# Patient Record
Sex: Male | Born: 1990 | Race: Black or African American | Hispanic: No | Marital: Single | Smoking: Never smoker
Health system: Southern US, Community
[De-identification: ages and names within clinical notes are randomized; demographics above are authoritative.]

---
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
## 2014-09-25 ENCOUNTER — Ambulatory Visit: Payer: Self-pay

## 2014-09-25 ENCOUNTER — Ambulatory Visit
Admission: EM | Admit: 2014-09-25 | Discharge: 2014-09-25 | Disposition: A | Discharge status: Home / Self Care | Payer: Self-pay | Attending: Family Medicine | Admitting: Family Medicine

## 2014-09-25 DIAGNOSIS — W228XXA Striking against or struck by other objects, initial encounter: Secondary | ICD-10-CM | POA: Insufficient documentation

## 2014-09-25 DIAGNOSIS — S92314A Nondisplaced fracture of first metatarsal bone, right foot, initial encounter for closed fracture: Secondary | ICD-10-CM | POA: Insufficient documentation

## 2014-09-25 DIAGNOSIS — S92301A Fracture of unspecified metatarsal bone(s), right foot, initial encounter for closed fracture: Secondary | ICD-10-CM

## 2014-09-25 NOTE — Discharge Instructions (Signed)
Metatarsal Fracture  °with Rehab °A metatarsal fracture is a break (fracture) of one of the bones of the mid-foot (metatarsal bones). The metatarsal bones are responsible for maintaining the arch of the foot. There are three classifications of metatarsal fractures: dancer's fractures, Jones fractures, and stress fractures. A dancer's fracture is when a piece of bone is pulled off by a ligament or tendon (avulsion fracture) of the outer part of the foot (fifth metatarsal), near the joint with the ankle bones. A Jones fracture occurs in the middle of the fifth metatarsal. These fractures have limited ability to heal. A stress fracture occurs when the bone is slowly injured faster than it can repair itself. °SYMPTOMS  °· Sharp pain, especially with standing or walking. °· Tenderness, swelling, and later bruising (contusion) of the foot. °· Numbness or paralysis from swelling in the foot, causing pressure on the blood vessels or nerves (uncommon). °CAUSES  °Fractures occur when a force is placed on the bone that is greater than it can handle. Common causes of injury include: °· Direct hit (trauma) to the foot. °· Twisting injury to the foot or ankle. °· Landing on the foot and ankle in an improper position. °RISK INCRESES WITH: °· Participation in contact sports, sports that require jumping and landing, or sports in which cleats are worn and sliding occurs. °· Previous foot or ankle sprains or dislocations. °· Repeated injury to any joint in the foot. °· Poor strength and flexibility. °PREVENTION °· Warm up and stretch properly before an activity. °· Allow for adequate recovery between workouts. °· Maintain physical fitness in: °¨ Strength, flexibility, and endurance. °¨ Cardiovascular fitness. °· When participating in jumping or contact sports, protect joints with supportive devices, such as wrapped elastic bandages, tape, braces, or high-top athletic shoes. °· Wear properly fitted and padded protective  equipment. °PROGNOSIS °If treated properly, metatarsal fractures usually heal well. Jones fractures have a higher risk of the bone failing to heal (nonunion). Sometimes, surgery is needed to heal Jones fractures.  °RELATED COMPLICATIONS  °· Nonunion. °· Fracture heals in a poor position (malunion). °· Long-term (chronic) pain, stiffness, or swelling of the foot. °· Excessive bleeding in the foot or at the dislocation site, causing pressure and injury to nerves and blood vessels (rare). °· Unstable or arthritic joint following repeated injury or delayed treatment. °TREATMENT  °Treatment first involves the use of ice and medicine, to reduce pain and inflammation. If the bone fragments are out of alignment (displaced), then immediate realigning of the bones (reduction) is required. Fractures that cannot be realigned by hand, or where the bones protrude through the skin (open), may require surgery to hold the fracture in place with screws, pins, and plates. After the bones are in proper alignment, the foot and ankle must be restrained for 6 or more weeks. Restraint allows healing to occur. After restraint, it is important to perform strengthening and stretching exercises to help regain strength and a full range of motion. These exercises may be completed at home or with a therapist. A stiff-soled shoe and arch support (orthotic) may be required when first returning to sports. °MEDICATION  °· If pain medicine is needed, nonsteroidal anti-inflammatory medicines (NSAIDS), or other minor pain relievers, are often advised. °· Do not take pain medicine for 7 days before surgery. °· Only take over-the-counter or prescription medicines for pain, fever, or discomfort as directed by your caregiver. °COLD THERAPY  °Cold treatment (icing) should be applied for 10 to 15 minutes every 2 to   3 hours for inflammations and pain, and immediately after activity that aggravates your symptoms. Use ice packs or ice massage. °SEEK MEDICAL CARE  IF: °· Pain, tenderness, or swelling gets worse, despite treatment. °· You experience pain, numbness, or coldness in the foot. °· Blue, gray, or dark color appears in the toenails. °· You or your child has an oral temperature above 102° F (38.9° C). °· You have increased pain, swelling, and redness. °· You have drainage of fluids or bleeding in the affected area. °· New, unexplained symptoms develop. (Drugs used in treatment may produce side effects.) °EXERCISES °RANGE OF MOTION (ROM) AND STRETCHING EXERCISES - Metatarsal Fracture (including Jones and Dancer's Fractures) °These exercises may help you when beginning to rehabilitate your injury. Your symptoms may resolve with or without further involvement from your physician, physical therapist, or athletic trainer. While completing these exercises, remember:  °· Restoring tissue flexibility helps normal motion to return to the joints. This allows healthier, less painful movement and activity. °· An effective stretch should be held for at least 30 seconds. °A stretch should never be painful. You should only feel a gentle lengthening or release in the stretched. °RANGE OF MOTION - Dorsi/Plantar Flexion °· While sitting with your right / left knee straight, draw the top of your foot upwards, by flexing your ankle. Then reverse the motion, pointing your toes downward. °· Hold each position for __________ seconds. °· After completing your first set of exercises, repeat this exercise with your knee bent. °Repeat __________ times. Complete this exercise __________ times per day.  °RANGE OF MOTION - Ankle Alphabet °· Imagine your right / left big toe is a pen. °· Keeping your hip and knee still, write out the entire alphabet with your "pen." Make the letters as large as you can, without increasing any discomfort. °Repeat __________ times. Complete this exercise __________ times per day.  °STRETCH - Gastroc, Standing °· Place your hands on a wall. °· Extend your right / left  leg behind you, keeping the front knee somewhat bent. °· Slightly point your toes inward on your back foot. °· Keeping your right / left heel on the floor and your knee straight, shift your weight toward the wall, not allowing your back to arch. °· You should feel a gentle stretch in the right / left calf. Hold this position for __________ seconds. °Repeat __________ times. Complete this stretch __________ times per day. °STRETCH - Soleus, Standing  °· Place your hands on a wall. °· Extend your right / left leg behind you, keeping the other knee somewhat bent. °· Slightly point your toes inward on your back foot. °· Keep your right / left heel on the floor, bend your back knee, and slightly shift your weight over the back leg so that you feel a gentle stretch deep in your back calf. °· Hold this position for __________ seconds. °Repeat __________ times. Complete this stretch __________ times per day. °STRENGTHENING EXERCISES - Metatarsal Fracture (Including Jones and Dancer's Fractures) °These exercises may help you when beginning to rehabilitate your injury. They may resolve your symptoms with or without further involvement from your physician, physical therapist, or athletic trainer. While completing these exercises, remember:  °· Muscles can gain both the endurance and the strength needed for everyday activities through controlled exercises. °· Complete these exercises as instructed by your physician, physical therapist or athletic trainer. Increase the resistance and repetitions only as guided by your caregiver. °STRENGTH - Dorsiflexors °· Secure a rubber exercise   band or tubing to a fixed object (table, pole) and loop the other end around your right / left foot. °· Sit on the floor facing the fixed object. The band should be slightly tense when your foot is relaxed. °· Slowly draw your foot back toward you, using your ankle and toes. °· Hold this position for __________ seconds. Slowly release the tension in  the band and return your foot to the starting position. °Repeat __________ times. Complete this exercise __________ times per day.  °STRENGTH - Plantar-flexors  °· Sit with your right / left leg extended. Holding onto both ends of a rubber exercise band or tubing, loop it around the ball of your foot. Keep a slight tension in the band. °· Slowly push your toes away from you, pointing them downward. °· Hold this position for __________ seconds. Return slowly, controlling the tension in the band. °Repeat __________ times. Complete this exercise __________ times per day.  °STRENGTH - Plantar-flexors °· Stand with your feet shoulder width apart. Steady yourself with a wall or table, using as little support as needed. °· Keeping your weight evenly spread over the width of your feet, rise up on your toes.* °· Hold this position for __________ seconds. °Repeat __________ times. Complete this exercise __________ times per day.  °*If this is too easy, shift your weight toward your right / left leg until you feel challenged. Ultimately, you may be asked to do this exercise while standing on your right / left foot only. °STRENGTH - Towel Curls °· Sit in a chair, on a non-carpeted surface. °· Place your foot on a towel, keeping your heel on the floor. °· Pull the towel toward your heel only by curling your toes. Keep your heel on the floor. °· If instructed by your physician, physical therapist, or athletic trainer, weight may be added at the end of the towel. °Repeat __________ times. Complete this exercise __________ times per day. °STRENGTH - Ankle Eversion °· Secure one end of a rubber exercise band or tubing to a fixed object (table, pole). Loop the other end around your foot, just before your toes. °· Place your fists between your knees. This will focus your strengthening at your ankle. °· Drawing the band across your opposite foot, away from the pole, slowly, pull your little toe out and up. Make sure the band is  positioned to resist the entire motion. °· Hold this position for __________ seconds. °· Have your muscles resist the band, as it slowly pulls your foot back to the starting position. °Repeat __________ times. Complete this exercise __________ times per day.  °STRENGTH - Ankle Inversion °· Secure one end of a rubber exercise band or tubing to a fixed object (table, pole). Loop the other end around your foot, just before your toes. °· Place your fists between your knees. This will focus your strengthening at your ankle. °· Slowly, pull your big toe up and in, making sure the band is positioned to resist the entire motion. °· Hold this position for __________ seconds. °· Have your muscles resist the band, as it slowly pulls your foot back to the starting position. °Repeat __________ times. Complete this exercises __________ times per day.  °Document Released: 02/09/2005 Document Revised: 05/04/2011 Document Reviewed: 05/25/2013 °[employer]® Patient Information ©2015 [employer]. This information is not intended to replace advice given to you by your health care provider. Make sure you discuss any questions you have with your health care provider. ° °

## 2014-09-25 NOTE — ED Provider Notes (Signed)
CSN: [id number]     Arrival date & time 09/25/14  1653 History   First MD Initiated Contact with Patient 09/25/14 1719     Chief Complaint  Patient presents with  . Foot Pain   (Consider location/radiation/quality/duration/timing/severity/associated sxs/prior Treatment) HPI  24 yo M working out in gym yesterday-dropped a 35 pound weight plate and it dropped on the floor, flipped over and landed on his right great toe and right forefoot Has had pain at base of great toe with swelling. Treated with ibuprofen. History reviewed. No pertinent past medical history. Past Surgical History  Procedure Laterality Date  . Keyloid excision Right 2011    ear   History reviewed. No pertinent family history. History  Substance Use Topics  . Smoking status: Never Smoker   . Smokeless tobacco: Never Used  . Alcohol Use: No    Review of Systems Review of 10 systems negative for acute change except as referenced in HPI Allergies  Review of patient's allergies indicates no known allergies.  Home Medications   Prior to Admission medications   Not on File   BP 123/68 mmHg  Pulse 69  Temp(Src) 98.4 F (36.9 C) (Oral)  Resp 16  Ht  (1.778 m)  Wt 153 lb (69.4 kg)  BMI 21.95 kg/m2  SpO2 100% Physical Exam   Constitutional -alert and oriented,well appearing and in no acute distress Head-atraumatic, normocephalic Eyes- conjunctiva normal, EOMI ,conjugate gaze Nose- no congestion or rhinorrhea Mouth/throat- mucous membranes moist , Neck- supple  CV- regular rate, grossly normal heart sounds,  Resp-no distress, normal respiratory effort,clear to auscultation bilaterally GI- no distention GU-  not examined MSK- right foot with dorsal swelling and mild ecchymosis particularly over the base of the great toe and the metatarsal area.Has pain on bottom of foot when he walks. Neuro- normal speech and language, no gross focal neurological deficit appreciated,  Skin-warm,dry ,intact; no rash  noted Psych-mood and affect grossly normal; speech and behavior grossly normal ED Course  Procedures (including critical care time) Labs Review Labs Reviewed - No data to display  Imaging Review Dg Toe Great Right  09/25/2014   CLINICAL DATA:  Right great toe pain after dropping weight on foot today. Able to bear weight with pain. No previous injury.  EXAM: RIGHT GREAT TOE  COMPARISON:  None.  FINDINGS: There is a nondisplaced fracture of the first metatarsal. Fracture extends from the midshaft to the distal metaphysis. It does not involve the first metatarsophalangeal joint articular surface. There is no fracture angulation. Adjacent soft tissue swelling is seen.  No other fractures.  Joints are normally spaced and aligned.  IMPRESSION: Nondisplaced fracture of the right first metatarsal as described.   Electronically Signed   By: Amie Portland M.D.   On: 09/25/2014 17:50   Fitted with boot orthosis and sock  MDM   1. Metatarsal fracture, right, closed, initial encounter    .Plan: 1. X-ray results and diagnosis reviewed with patient-metatarsal fracture. 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 He insists that he needs to wear steel toe shoes at work. Strongly encouraged to keep foot in rigid support at all times. 2. Needs Orthopedic evaluation/recommendation at new site- disc and printed copy report sent with patient for his assistance/reference 3. Recommend supportive treatment with ice , ibuprofen , elevation 4. Seek care immediately with local ER with increase swelling, neurosensory changes, increased pain ir bruising    Rae Halsted, PA-C 09/25/14 1824

## 2014-09-25 NOTE — ED Notes (Signed)
Pt reports he dropped a 45-50 pound weight on his right foot. Pt reports there was swelling after the accident, but it has since gone down. Pt denies pain with weight bearing.

## 2015-12-14 IMAGING — CR DG TOE GREAT 2+V*R*
3 series · 3 of 3 positions shown · non-contrast
Comparison: None.

CLINICAL DATA: Right great toe pain after dropping weight on foot
today. Able to bear weight with pain. No previous injury.

EXAM:
RIGHT GREAT TOE

[toe ap]
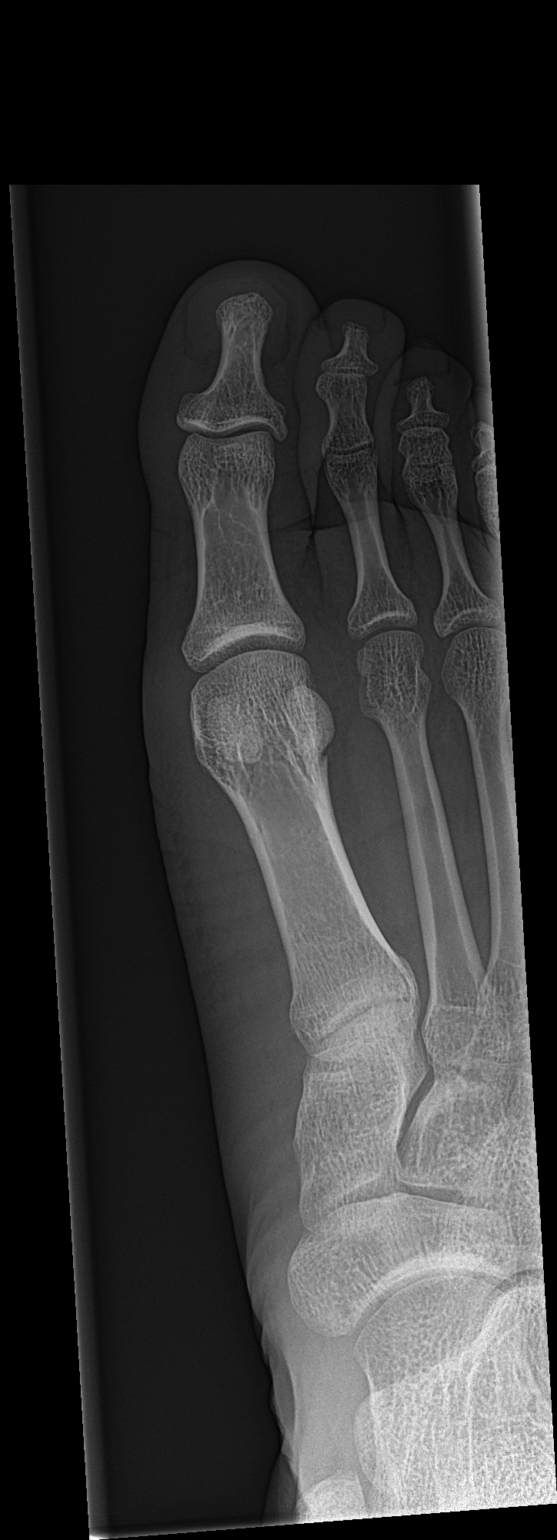

[toe obl]
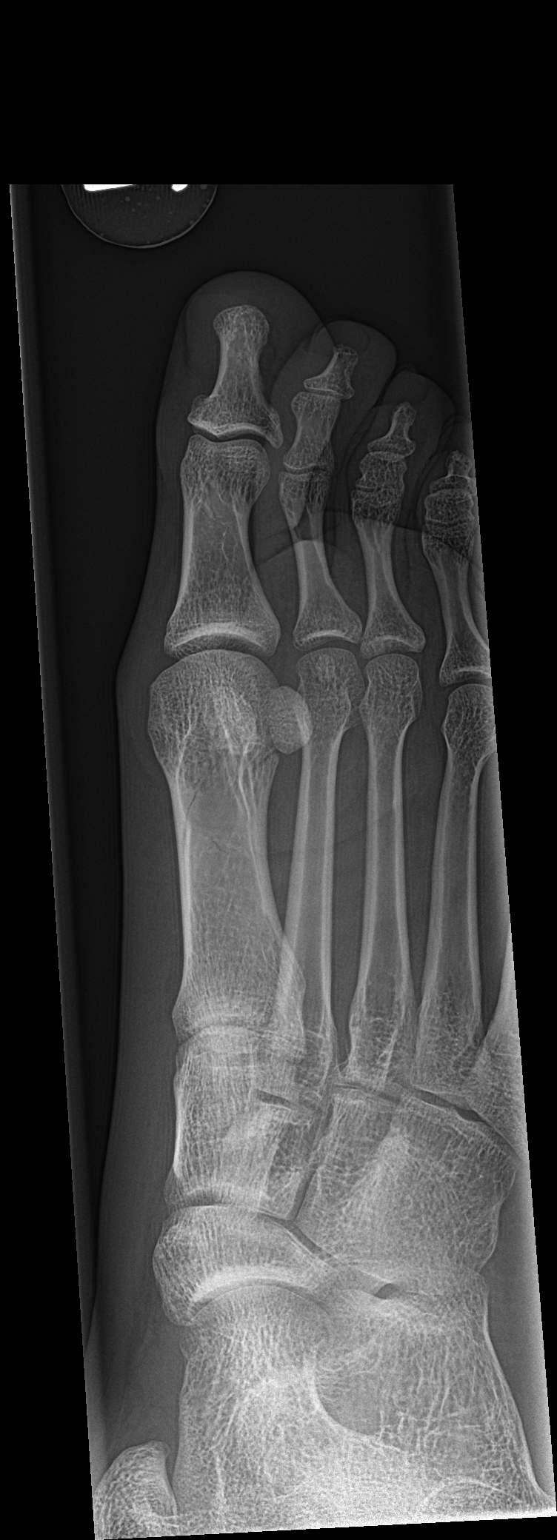

[toe lat]
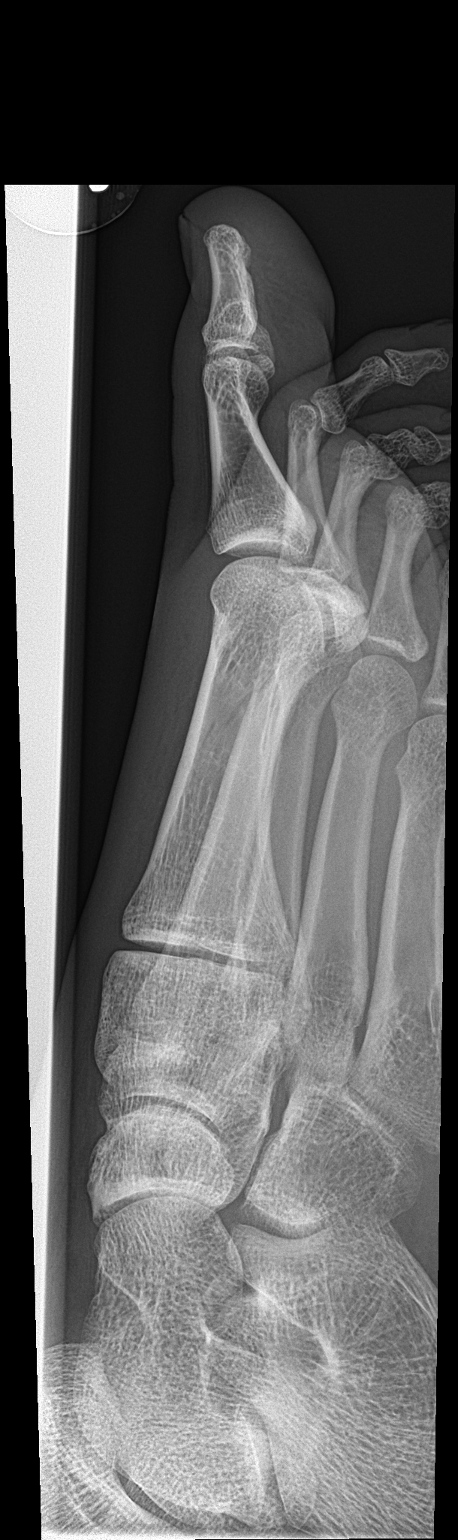

[3 of 3 positions shown; findings below may reference images not displayed]

FINDINGS: There is a nondisplaced fracture of the first metatarsal. Fracture
extends from the midshaft to the distal metaphysis. It does not
involve the first metatarsophalangeal joint articular surface. There
is no fracture angulation. Adjacent soft tissue swelling is seen.

No other fractures.  Joints are normally spaced and aligned.
IMPRESSION: Nondisplaced fracture of the right first metatarsal as described.
